# Patient Record
Sex: Female | Born: 1950 | Race: Black or African American | Hispanic: No | Marital: Single | Smoking: Never smoker
Health system: Southern US, Community
[De-identification: ages and names within clinical notes are randomized; demographics above are authoritative.]

## PROBLEM LIST (undated history)

## (undated) DIAGNOSIS — I10 Essential (primary) hypertension: Secondary | ICD-10-CM

## (undated) DIAGNOSIS — E119 Type 2 diabetes mellitus without complications: Secondary | ICD-10-CM

---

## 2005-03-03 ENCOUNTER — Encounter: Admission: RE | Admit: 2005-03-03 | Discharge: 2005-03-03 | Payer: Self-pay | Admitting: Orthopedic Surgery

## 2006-09-03 IMAGING — CR DG ANKLE COMPLETE 3+V*R*
3 series · 3 of 3 positions shown · non-contrast
Comparison: none

CLINICAL DATA: Tenderness over anteromedial aspect of the right ankle joint. 
 RIGHT ANKLE ? 3 VIEW:
 Three views of the right ankle were obtained.  The ankle joint space appears normal.  No acute abnormality is seen and normal alignment is present.  Minimal degenerative spurring is noted from the plantar aspect of the calcaneus as well as the insertion of the Achilles tendon.
TECHNIQUE: Multidetector helical scans through the right ankle were performed.  In addition to the axial images sagittal and coronal images of the right ankle were reconstructed and reviewed.
 There is mild degenerative change of the tibiotalar articulation with some loss of joint space and sclerosis.  Minimal spurring is noted from the medial malleolus where there are a few bone fragments which appear well corticated and probably relate to prior trauma.  Subtalar joint is relatively well preserved.  There is some degenerative change at the talonavicular joint which is narrowed with some sclerosis.  No erosions are seen and alignment is normal.  Small calcaneal degenerative spurs are present.

[view not recorded (1 of 3)]
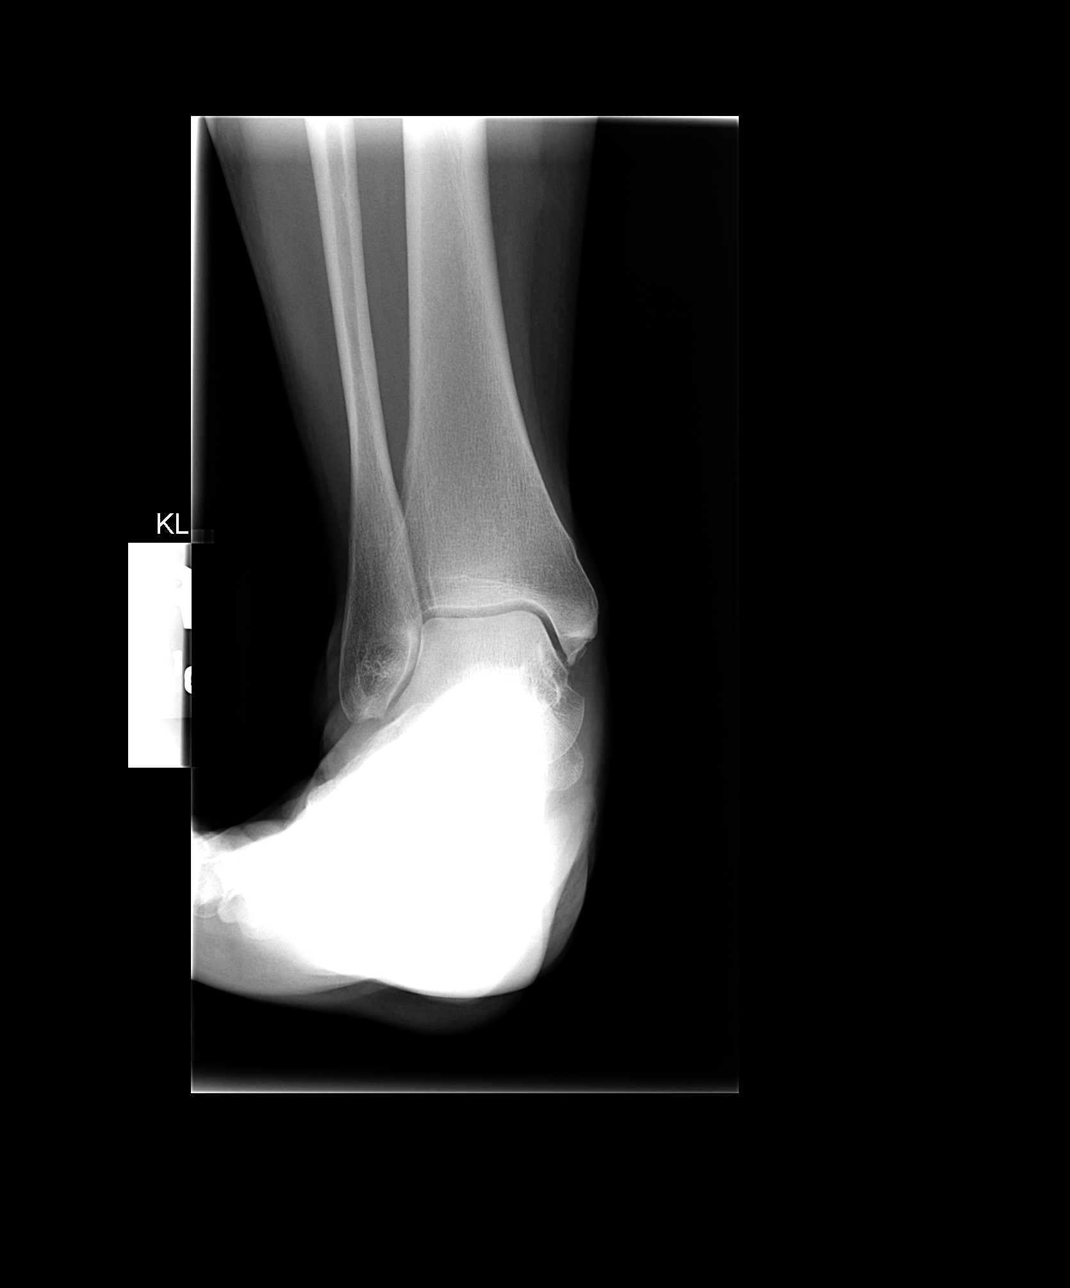

[view not recorded (2 of 3)]
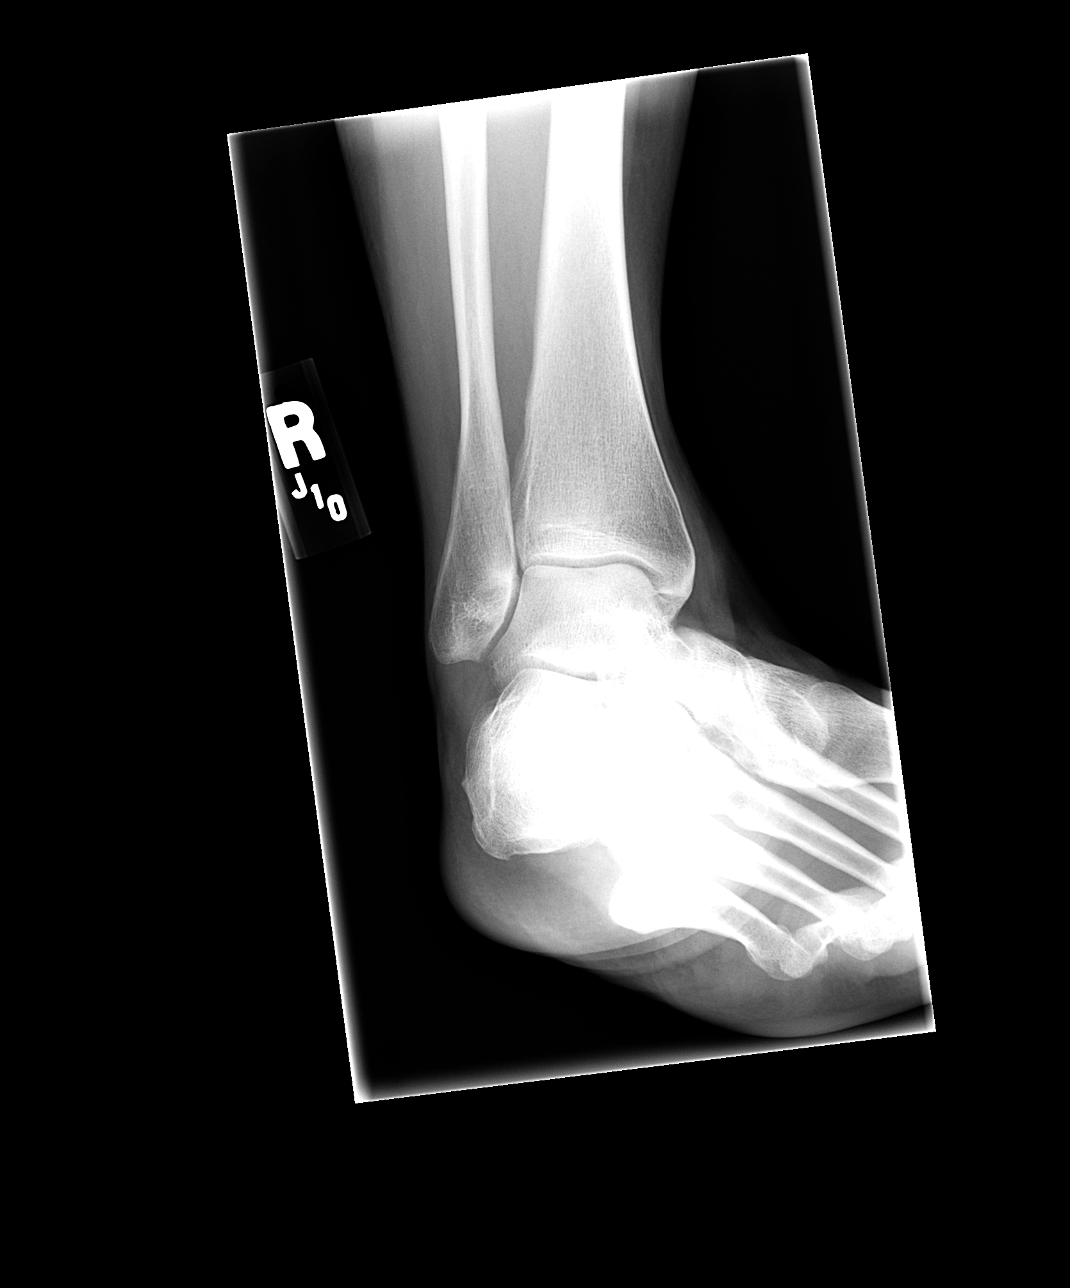

[view not recorded (3 of 3)]
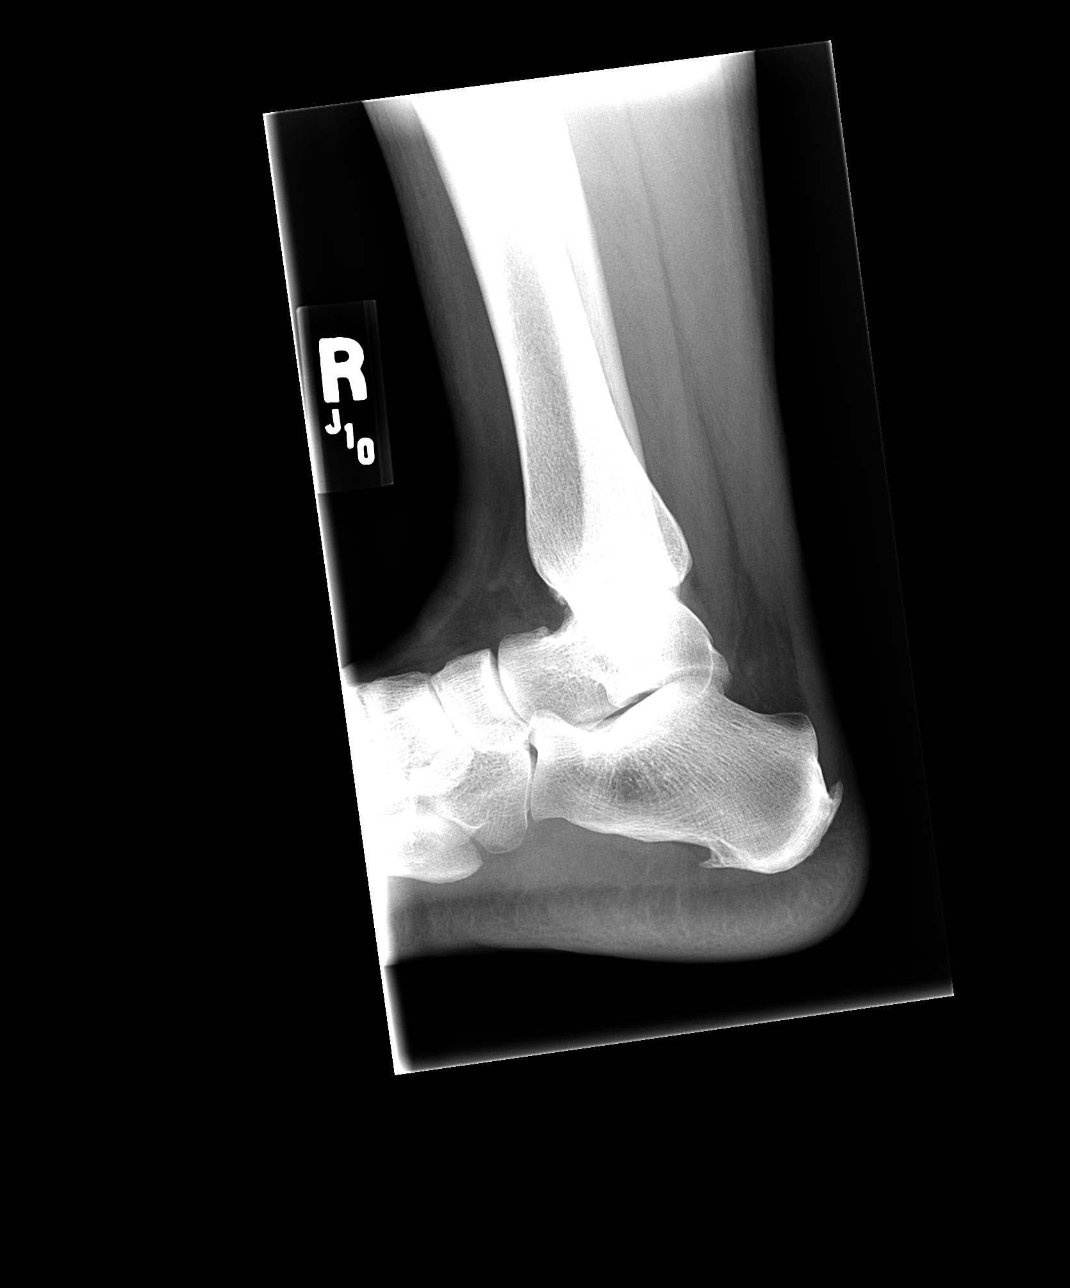

[3 of 3 positions shown; findings below may reference images not displayed]

IMPRESSION: No acute abnormality.  Calcaneal degenerative spurring is noted. 
 CT OF THE RIGHT ANKLE:
IMPRESSION: There are mild degenerative changes in the ankle but no acute abnormality is seen and alignment is normal.

## 2006-09-03 IMAGING — CT CT EXTREM LOW W/O CM*R*
3 of 5 series · 15 of 33 positions shown, 18 images · non-contrast
Comparison: none

CLINICAL DATA: Tenderness over anteromedial aspect of the right ankle joint. 
 RIGHT ANKLE ? 3 VIEW:
 Three views of the right ankle were obtained.  The ankle joint space appears normal.  No acute abnormality is seen and normal alignment is present.  Minimal degenerative spurring is noted from the plantar aspect of the calcaneus as well as the insertion of the Achilles tendon.
TECHNIQUE: Multidetector helical scans through the right ankle were performed.  In addition to the axial images sagittal and coronal images of the right ankle were reconstructed and reviewed.
 There is mild degenerative change of the tibiotalar articulation with some loss of joint space and sclerosis.  Minimal spurring is noted from the medial malleolus where there are a few bone fragments which appear well corticated and probably relate to prior trauma.  Subtalar joint is relatively well preserved.  There is some degenerative change at the talonavicular joint which is narrowed with some sclerosis.  No erosions are seen and alignment is normal.  Small calcaneal degenerative spurs are present.

[Series 3: recon 2: rt ankle · axial · 0.31mm/px · z∈[-69,+36]mm · 7 of 225 slices shown, 9 images]
[im 29/225  soft-tissue]
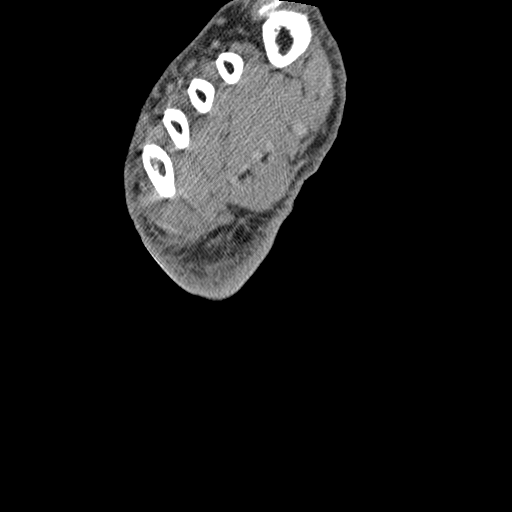
[im 29/225  bone]
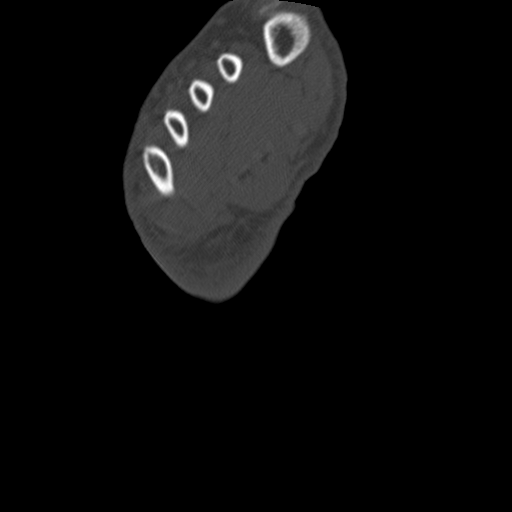
[im 57/225  bone]
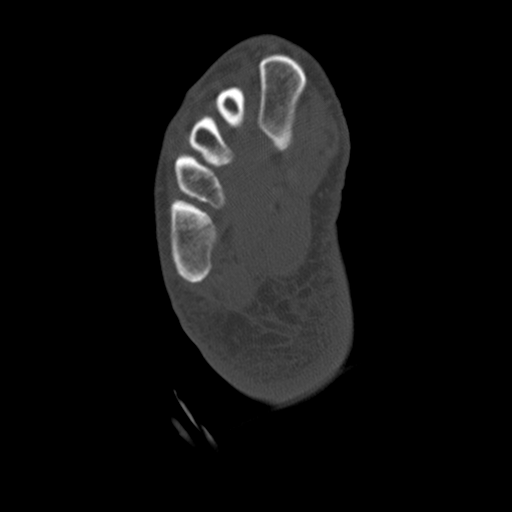
[im 85/225  bone]
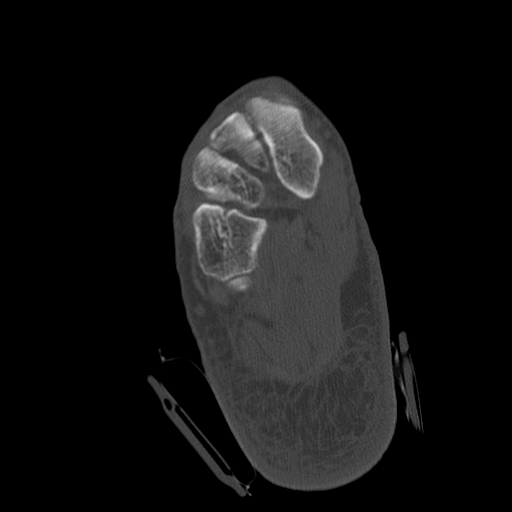
[im 113/225  bone]
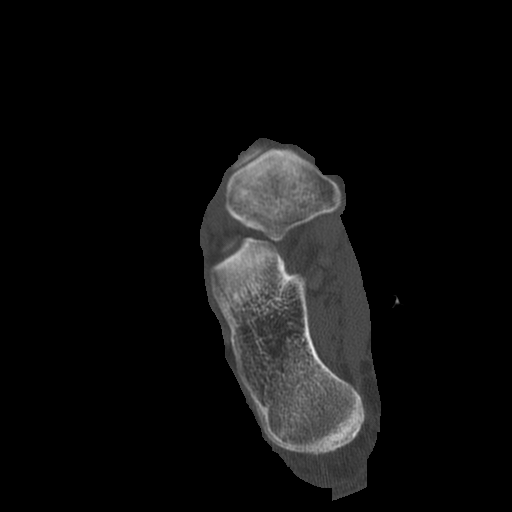
[im 141/225  soft-tissue]
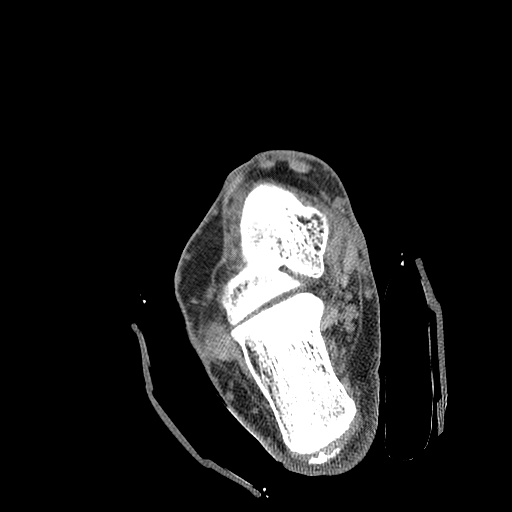
[im 141/225  bone]
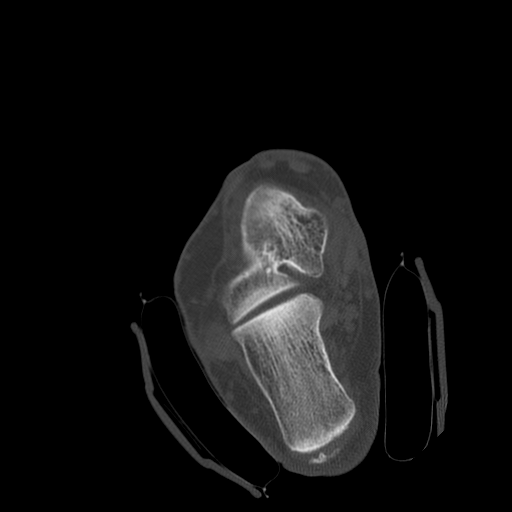
[im 169/225  bone]
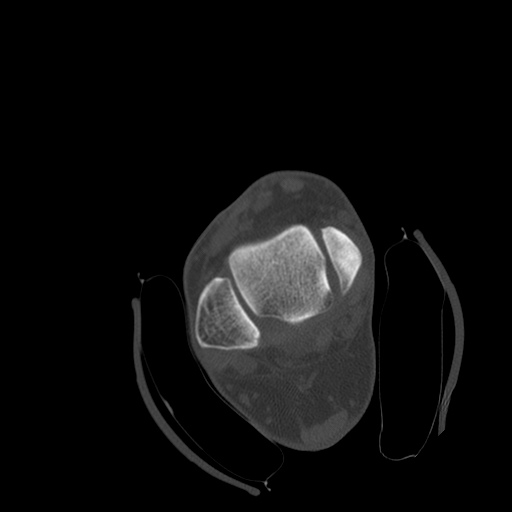
[im 197/225  bone]
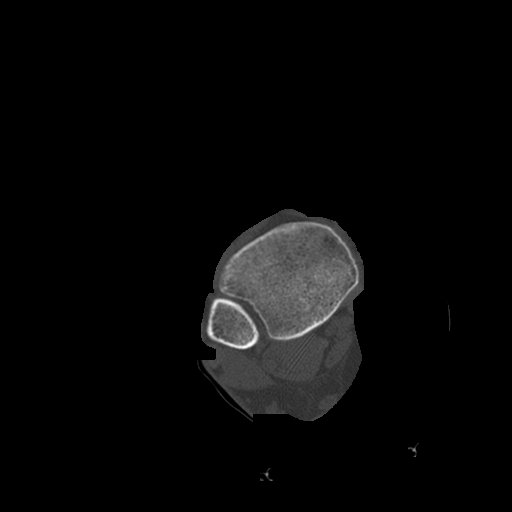

[Series 300: reformatted · sagittal · 0.31mm/px · 3 of 40 slices shown (1 of 2)]
[im 8/40  bone]
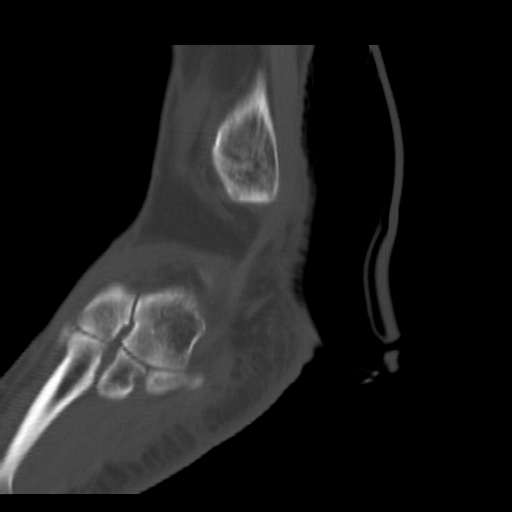
[im 16/40  bone]
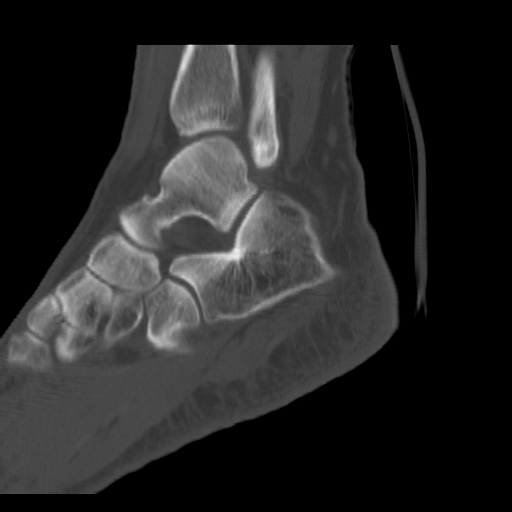
[im 24/40  bone]
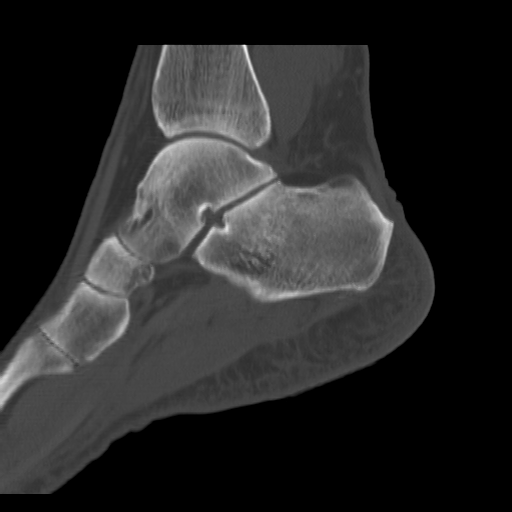

[Series 301: reformatted · coronal · 0.31mm/px · 5 of 60 slices shown, 6 images (2 of 2)]
[im 20/60  bone]
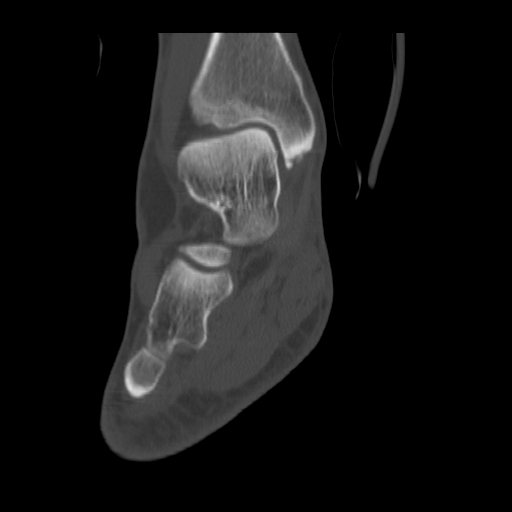
[im 25/60  bone]
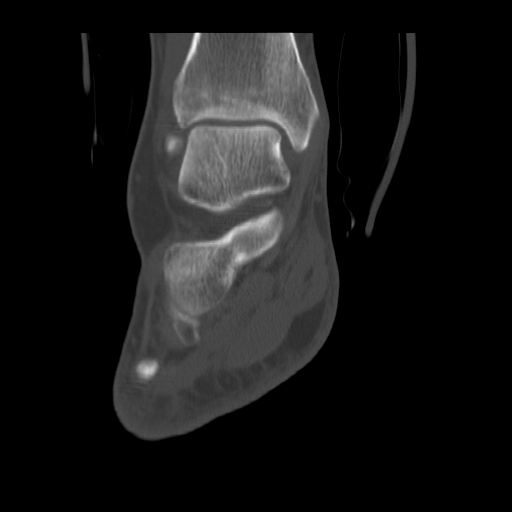
[im 30/60  soft-tissue]
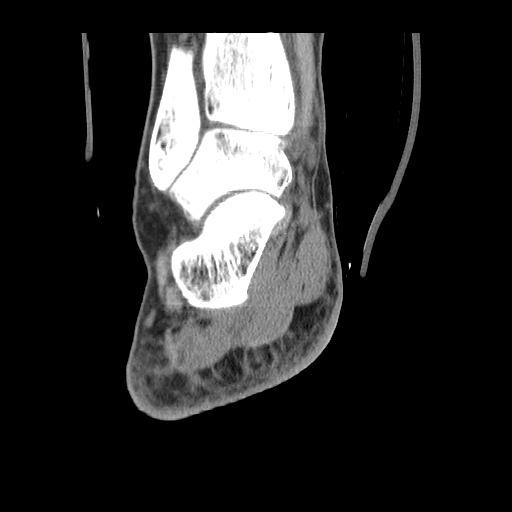
[im 30/60  bone]
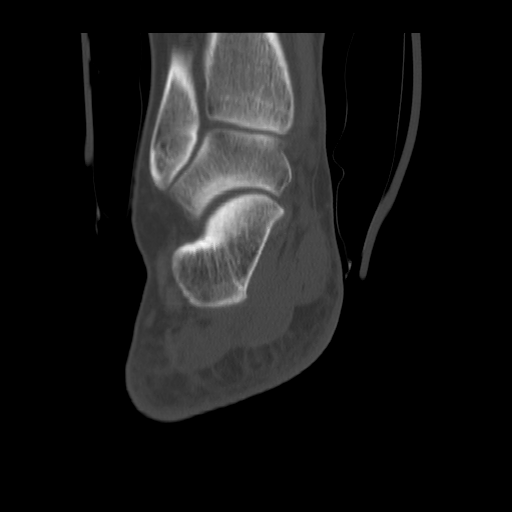
[im 35/60  bone]
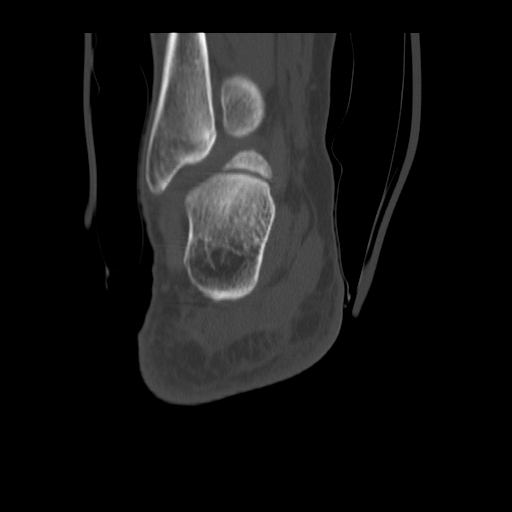
[im 40/60  bone]
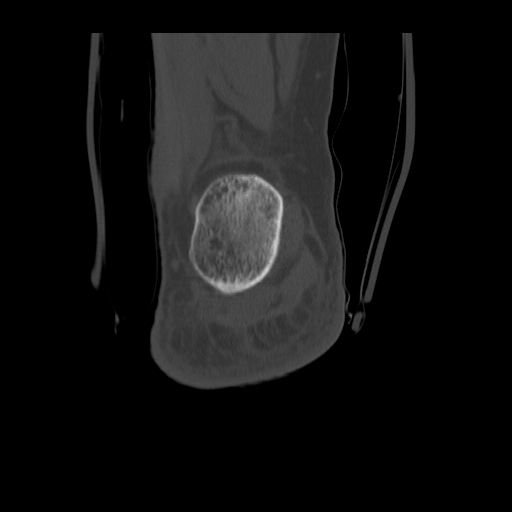

[15 of 33 positions shown; findings below may reference images not displayed]

IMPRESSION: No acute abnormality.  Calcaneal degenerative spurring is noted. 
 CT OF THE RIGHT ANKLE:
IMPRESSION: There are mild degenerative changes in the ankle but no acute abnormality is seen and alignment is normal.

## 2016-05-29 ENCOUNTER — Encounter: Payer: Self-pay | Admitting: Emergency Medicine

## 2016-05-29 ENCOUNTER — Emergency Department (INDEPENDENT_AMBULATORY_CARE_PROVIDER_SITE_OTHER)
Admission: EM | Admit: 2016-05-29 | Discharge: 2016-05-29 | Disposition: A | Discharge status: Home / Self Care | Payer: Medicare Other | Source: Home / Self Care | Attending: Family Medicine | Admitting: Family Medicine

## 2016-05-29 DIAGNOSIS — Z8709 Personal history of other diseases of the respiratory system: Secondary | ICD-10-CM

## 2016-05-29 DIAGNOSIS — J069 Acute upper respiratory infection, unspecified: Secondary | ICD-10-CM

## 2016-05-29 DIAGNOSIS — B9789 Other viral agents as the cause of diseases classified elsewhere: Secondary | ICD-10-CM

## 2016-05-29 DIAGNOSIS — J029 Acute pharyngitis, unspecified: Secondary | ICD-10-CM

## 2016-05-29 HISTORY — DX: Type 2 diabetes mellitus without complications: E11.9

## 2016-05-29 LAB — POCT RAPID STREP A (OFFICE): RAPID STREP A SCREEN: NEGATIVE

## 2016-05-29 MED ORDER — BENZONATATE 200 MG PO CAPS: 200.0000 mg | ORAL_CAPSULE | Freq: Every day | ORAL | Status: AC

## 2016-05-29 NOTE — ED Notes (Signed)
Pt c/o sore throat and body aches x5 days. States she was given augmentin x4 days ago with no improvement. Denies fever.

## 2016-05-29 NOTE — Discharge Instructions (Signed)
Begin taking prednisone as prescribed.  Continue taking Augmentin.  Use Advair and albuterol inhalers as prescribed. Take plain guaifenesin (1200mg  extended release tabs such as Mucinex) twice daily, with plenty of water, for cough and congestion.  May add Pseudoephedrine (30mg , one or two every 4 to 6 hours) for sinus congestion.  Get adequate rest.   Try warm salt water gargles for sore throat.  Stop all antihistamines for now, and other non-prescription cough/cold preparations.   Follow-up with family doctor if not improving about 7 to10 days.

## 2016-05-29 NOTE — ED Provider Notes (Signed)
CSN: [id number]     Arrival date & time 05/29/16  1617 History   None    Chief Complaint  Patient presents with  . Sore Throat      HPI Comments: Patient complains of four day history of typical cold-like symptoms developing over several days,  including mild sore throat, sinus congestion, headache, fatigue, myalgias, and cough.  The cough is non-productive and worse at night.  She was started on Augmentin 875mg  BID yesterday and has experienced no improvement.  She has a history of asthma.  She has available both albuterol and Advair inhalers.  The history is provided by the patient.    Past Medical History  Diagnosis Date  . Diabetes mellitus without complication (HCC)    History reviewed. No pertinent past surgical history. History reviewed. No pertinent family history. Social History  Substance Use Topics  . Smoking status: Never Smoker   . Smokeless tobacco: Never Used  . Alcohol Use: No   OB History    No data available     Review of Systems + sore throat + cough No pleuritic pain No wheezing + nasal congestion + post-nasal drainage No sinus pain/pressure No itchy/red eyes No earache No hemoptysis No SOB No fever/chills No nausea No vomiting No abdominal pain No diarrhea No urinary symptoms No skin rash + fatigue + myalgias + headache Used OTC meds without relief  Allergies  Review of patient's allergies indicates not on file.  Home Medications   Prior to Admission medications   Medication Sig Start Date End Date Taking? Authorizing Provider  metFORMIN (GLUCOPHAGE) 1000 MG tablet Take 1,000 mg by mouth 2 (two) times daily with a meal.   Yes Historical Provider, MD  benzonatate (TESSALON) 200 MG capsule Take 1 capsule (200 mg total) by mouth at bedtime. Take as needed for cough 05/29/16   Lattie HawStephen A Beese, MD   Meds Ordered and Administered this Visit  Medications - No data to display  BP 127/86 mmHg  Pulse 96  Temp(Src) 98.5 F (36.9 C) (Oral)  Wt  167 lb (75.751 kg)  SpO2 96% No data found.   Physical Exam Nursing notes and Vital Signs reviewed. Appearance:  Patient appears stated age, and in no acute distress Eyes:  Pupils are equal, round, and reactive to light and accomodation.  Extraocular movement is intact.  Conjunctivae are not inflamed  Ears:  Canals normal.  Tympanic membranes normal.  Nose:  Mildly congested turbinates.  No sinus tenderness.   Pharynx: Minimal erythema  Neck:  Supple.  Tender enlarged posterior/lateral nodes are palpated bilaterally  Lungs:  Scattered coarse rhonchi.  Breath sounds are equal.  Moving air well. Chest:  Distinct tenderness to palpation over the mid-sternum.  Heart:  Regular rate and rhythm without murmurs, rubs, or gallops.  Abdomen:  Nontender without masses or hepatosplenomegaly.  Bowel sounds are present.  No CVA or flank tenderness.  Extremities:  No edema.  Skin:  No rash present.   ED Course  Procedures none    Labs Reviewed  POCT RAPID STREP A (OFFICE) negative     MDM   1. Acute pharyngitis, unspecified etiology   2. Viral URI with cough   3. Personal history of asthma    Prescription written for Benzonatate (Tessalon) to take at bedtime for night-time cough.  Begin taking prednisone as prescribed.  Continue taking Augmentin.  Use Advair and albuterol inhalers as prescribed. Take plain guaifenesin (1200mg  extended release tabs such as Mucinex) twice daily, with  plenty of water, for cough and congestion.  May add Pseudoephedrine ( , one or two every 4 to 6 hours) for sinus congestion.  Get adequate rest.   Try warm salt water gargles for sore throat.  Stop all antihistamines for now, and other non-prescription cough/cold preparations.   Follow-up with family doctor if not improving about 7 to10 days.     Lattie Haw, MD 06/06/16 1351

## 2020-01-03 ENCOUNTER — Other Ambulatory Visit: Payer: Self-pay

## 2020-01-03 ENCOUNTER — Ambulatory Visit: Payer: Medicare Other | Attending: Internal Medicine

## 2020-01-03 DIAGNOSIS — Z23 Encounter for immunization: Secondary | ICD-10-CM | POA: Insufficient documentation

## 2020-01-03 NOTE — Progress Notes (Signed)
   Covid-19 Vaccination Clinic  Name:  Tina Jacobs    MRN: [id number] DOB: July 18, 1951  01/03/2020  [name] was observed post Covid-19 immunization for 30 minutes based on pre-vaccination screening without incidence. She was provided with Vaccine Information Sheet and instruction to access the V-Safe system.   [name] was instructed to call 911 with any severe reactions post vaccine: Marland Kitchen Difficulty breathing  . Swelling of your face and throat  . A fast heartbeat  . A bad rash all over your body  . Dizziness and weakness    Immunizations Administered    Name Date Dose VIS Date Route   Moderna COVID-19 Vaccine 01/03/2020 10:01 AM 0.5 mL 10/23/2019 Intramuscular   Manufacturer: Moderna   Lot: [id number]   NDC: 12787-183-67

## 2020-02-04 ENCOUNTER — Ambulatory Visit: Payer: Medicare Other | Attending: Internal Medicine

## 2020-02-04 DIAGNOSIS — Z23 Encounter for immunization: Secondary | ICD-10-CM

## 2020-02-04 NOTE — Progress Notes (Signed)
   Covid-19 Vaccination Clinic  Name:  Tina Jacobs    MRN: [id number] DOB: [birth year]  02/04/2020  [name] was observed post Covid-19 immunization for 15 minutes without incident. She was provided with Vaccine Information Sheet and instruction to access the V-Safe system.   [name] was instructed to call 911 with any severe reactions post vaccine: Marland Kitchen Difficulty breathing  . Swelling of face and throat  . A fast heartbeat  . A bad rash all over body  . Dizziness and weakness   Immunizations Administered    Name Date Dose VIS Date Route   Moderna COVID-19 Vaccine 02/04/2020  9:56 AM 0.5 mL 10/23/2019 Intramuscular   Manufacturer: Moderna   Lot: [id number]   NDC: 19012-224-11

## 2024-02-11 ENCOUNTER — Other Ambulatory Visit: Payer: Self-pay

## 2024-02-11 ENCOUNTER — Ambulatory Visit: Admission: EM | Admit: 2024-02-11 | Discharge: 2024-02-11 | Disposition: A | Discharge status: Home / Self Care

## 2024-02-11 DIAGNOSIS — I10 Essential (primary) hypertension: Secondary | ICD-10-CM | POA: Diagnosis not present

## 2024-02-11 HISTORY — DX: Essential (primary) hypertension: I10

## 2024-02-11 NOTE — Discharge Instructions (Addendum)
 Advised patient to follow-up with her PCP regarding blood pressure medication and taking previously prescribed antihypertensive.  Advised patient to check blood pressure in the morning prior to breakfast for the next 7 days.  Advised to log these measurements so that your PCP can better understand your daily blood pressure trends.  Advised if symptoms/blood pressure measurements worsen and/or unresolved please follow-up with your PCP or here for further evaluation.

## 2024-02-11 NOTE — ED Provider Notes (Signed)
 [facility]    CSN: [id number] Arrival date & time: 02/11/24  0827      History   Chief Complaint Chief Complaint  Patient presents with   Headache   Hypertension    HPI Tina Jacobs is a 73 y.o. female.   HPI Pleasant 73 year old female presents with headache and high blood pressure readings from yesterday and is concerned.  Patient reports discontinuing her antihypertensive medication (lisinopril 5 mg daily) and her lipid-lowering agent several months ago on her own.  PMH significant for T2DM without complication and HTN.  Past Medical History:  Diagnosis Date   Diabetes mellitus without complication (HCC)    Hypertension     There are no active problems to display for this patient.   History reviewed. No pertinent surgical history.  OB History   No obstetric history on file.      Home Medications    Prior to Admission medications   Medication Sig Start Date End Date Taking? Authorizing Provider  benzonatate (TESSALON) 200 MG capsule Take 1 capsule (200 mg total) by mouth at bedtime. Take as needed for cough 05/29/16   Lattie Haw, MD  HUMULIN 70/30 (70-30) 100 UNIT/ML injection PLEASE SEE ATTACHED FOR DETAILED DIRECTIONS    [provider]  metFORMIN (GLUCOPHAGE) 1000 MG tablet Take 1,000 mg by mouth 2 (two) times daily with a meal.    [provider]    Family History History reviewed. No pertinent family history.  Social History Social History   Tobacco Use   Smoking status: Never   Smokeless tobacco: Never  Substance Use Topics   Alcohol use: No   Drug use: No     Allergies   Patient has no known allergies.   Review of Systems Review of Systems  Neurological:  Positive for headaches.     Physical Exam Triage Vital Signs ED Triage Vitals [02/11/24 0842]  Encounter Vitals Group     BP      Systolic BP Percentile      Diastolic BP Percentile      Pulse      Resp      Temp      Temp src       SpO2      Weight      Height      Head Circumference      Peak Flow      Pain Score 8     Pain Loc      Pain Education      Exclude from Growth Chart    No data found.  Updated Vital Signs BP (!) 153/91 (BP Location: Left Arm)   Pulse 89   Temp 97.9 F (36.6 C) (Oral)   Resp 17   SpO2 96%   Visual Acuity Right Eye Distance:   Left Eye Distance:   Bilateral Distance:    Right Eye Near:   Left Eye Near:    Bilateral Near:     Physical Exam Vitals and nursing note reviewed.  Constitutional:      General: She is not in acute distress.    Appearance: Normal appearance. She is obese. She is not ill-appearing.  HENT:     Head: Normocephalic and atraumatic.     Mouth/Throat:     Mouth: Mucous membranes are moist.     Pharynx: Oropharynx is clear.  Eyes:     Extraocular Movements: Extraocular movements intact.     Conjunctiva/sclera: Conjunctivae normal.  Pupils: Pupils are equal, round, and reactive to light.  Cardiovascular:     Rate and Rhythm: Normal rate and regular rhythm.     Pulses: Normal pulses.     Heart sounds: Normal heart sounds. No murmur heard.    Comments: BP left arm seated during my exam manual-144/92 Pulmonary:     Effort: Pulmonary effort is normal.     Breath sounds: Normal breath sounds. No wheezing, rhonchi or rales.  Chest:     Chest wall: No tenderness.  Musculoskeletal:        General: Normal range of motion.     Cervical back: Normal range of motion and neck supple.  Skin:    General: Skin is warm and dry.  Neurological:     General: No focal deficit present.     Mental Status: She is alert and oriented to person, place, and time. Mental status is at baseline.  Psychiatric:        Mood and Affect: Mood normal.        Behavior: Behavior normal.      UC Treatments / Results  Labs (all labs ordered are listed, but only abnormal results are displayed) Labs Reviewed - No data to display  EKG   Radiology No results  found.  Procedures Procedures (including critical care time)  Medications Ordered in UC Medications - No data to display  Initial Impression / Assessment and Plan / UC Course  I have reviewed the triage vital signs and the nursing notes.  Pertinent labs & imaging results that were available during my care of the patient were reviewed by me and considered in my medical decision making (see chart for details).     MDM: 1.  Hypertension, unspecified type-strongly encouraged patient to restart lisinopril 5 mg daily and to follow-up with PCP regarding lipid-lowering agent restart. Advised patient to follow-up with her PCP regarding blood pressure medication and taking previously prescribed antihypertensive.  Advised patient to check blood pressure in the morning prior to breakfast for the next 7 days.  Advised to log these measurements so that your PCP can better understand your daily blood pressure trends.  Advised if symptoms/blood pressure measurements worsen and/or unresolved please follow-up with your PCP or here for further evaluation.  Final Clinical Impressions(s) / UC Diagnoses   Final diagnoses:  Hypertension, unspecified type     Discharge Instructions      Advised patient to follow-up with her PCP regarding blood pressure medication and taking previously prescribed antihypertensive.  Advised patient to check blood pressure in the morning prior to breakfast for the next 7 days.  Advised to log these measurements so that your PCP can better understand your daily blood pressure trends.  Advised if symptoms/blood pressure measurements worsen and/or unresolved please follow-up with your PCP or here for further evaluation.     ED Prescriptions   None    PDMP not reviewed this encounter.   Trevor Iha, FNP 02/11/24 [phone]

## 2024-02-11 NOTE — ED Triage Notes (Addendum)
 Pt c/o HA since yesterday. Checked BP at home and it was 177/80. She went to [employer] after to double check and it was 210/80. Used to be on a BP med, but stopped a month ago.  No meds taken for HA.
